# Patient Record
Sex: Male | Born: 1985 | Race: White | Hispanic: No | Marital: Single | Smoking: Former smoker
Health system: Southern US, Community
[De-identification: ages and names within clinical notes are randomized; demographics above are authoritative.]

---

## 2015-03-30 ENCOUNTER — Ambulatory Visit: Payer: Worker's Compensation

## 2015-03-30 ENCOUNTER — Ambulatory Visit (INDEPENDENT_AMBULATORY_CARE_PROVIDER_SITE_OTHER): Payer: Worker's Compensation | Admitting: Family Medicine

## 2015-03-30 VITALS — BP 114/72 | HR 97 | Temp 99.1°F | Resp 18 | Ht 74.0 in | Wt 251.0 lb

## 2015-03-30 DIAGNOSIS — M79675 Pain in left toe(s): Secondary | ICD-10-CM

## 2015-03-30 DIAGNOSIS — Y990 Civilian activity done for income or pay: Secondary | ICD-10-CM

## 2015-03-30 DIAGNOSIS — S90122A Contusion of left lesser toe(s) without damage to nail, initial encounter: Secondary | ICD-10-CM

## 2015-03-30 NOTE — Patient Instructions (Addendum)
Take ibuprofen 800 mg (4200 mg) maximum of 3 times daily for pain  Ice several times daily for 2 or 3 days  Wear postop shoe or a fairly firm sole shoe of his liking.  Return in one week for recheck

## 2015-03-30 NOTE — Progress Notes (Signed)
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 He dropped a 30 pound pull up on his foot this morning at work. The angle age of the polyp hit his left foot through the sneaker shoes that he had on. He has an abrasion and bruising of the DIP and distal aspect of the dorsum of the left large toe. It hurts.  Objective: Ecchymosis and abrasion from the DIP joint to the base of the nail is noted. There is no subungual hematoma noted. There is not much tenderness on the sole of the toe, but the area of bruising in the joint are quite tender. He says there is a numbness sensation around the end of the nail.  Assessment: Left large toe injury  Plan: X-ray large toe  [facility] reading (PRIMARY) by  Dr. Alwyn RenHopper No fracture noted  Contusion and pain left great toe  Plan: Will have him wear a postop shoe and get rechecked in 1 week, sooner if it is feeling very well and he wants to get out of shoe..Marland Kitchen

## 2016-11-08 IMAGING — CR DG TOE GREAT 2+V*L*
3 series · 3 of 3 positions shown · non-contrast
Comparison: None.

CLINICAL DATA: Dropped weight on foot

EXAM:
LEFT GREAT TOE

[AP]
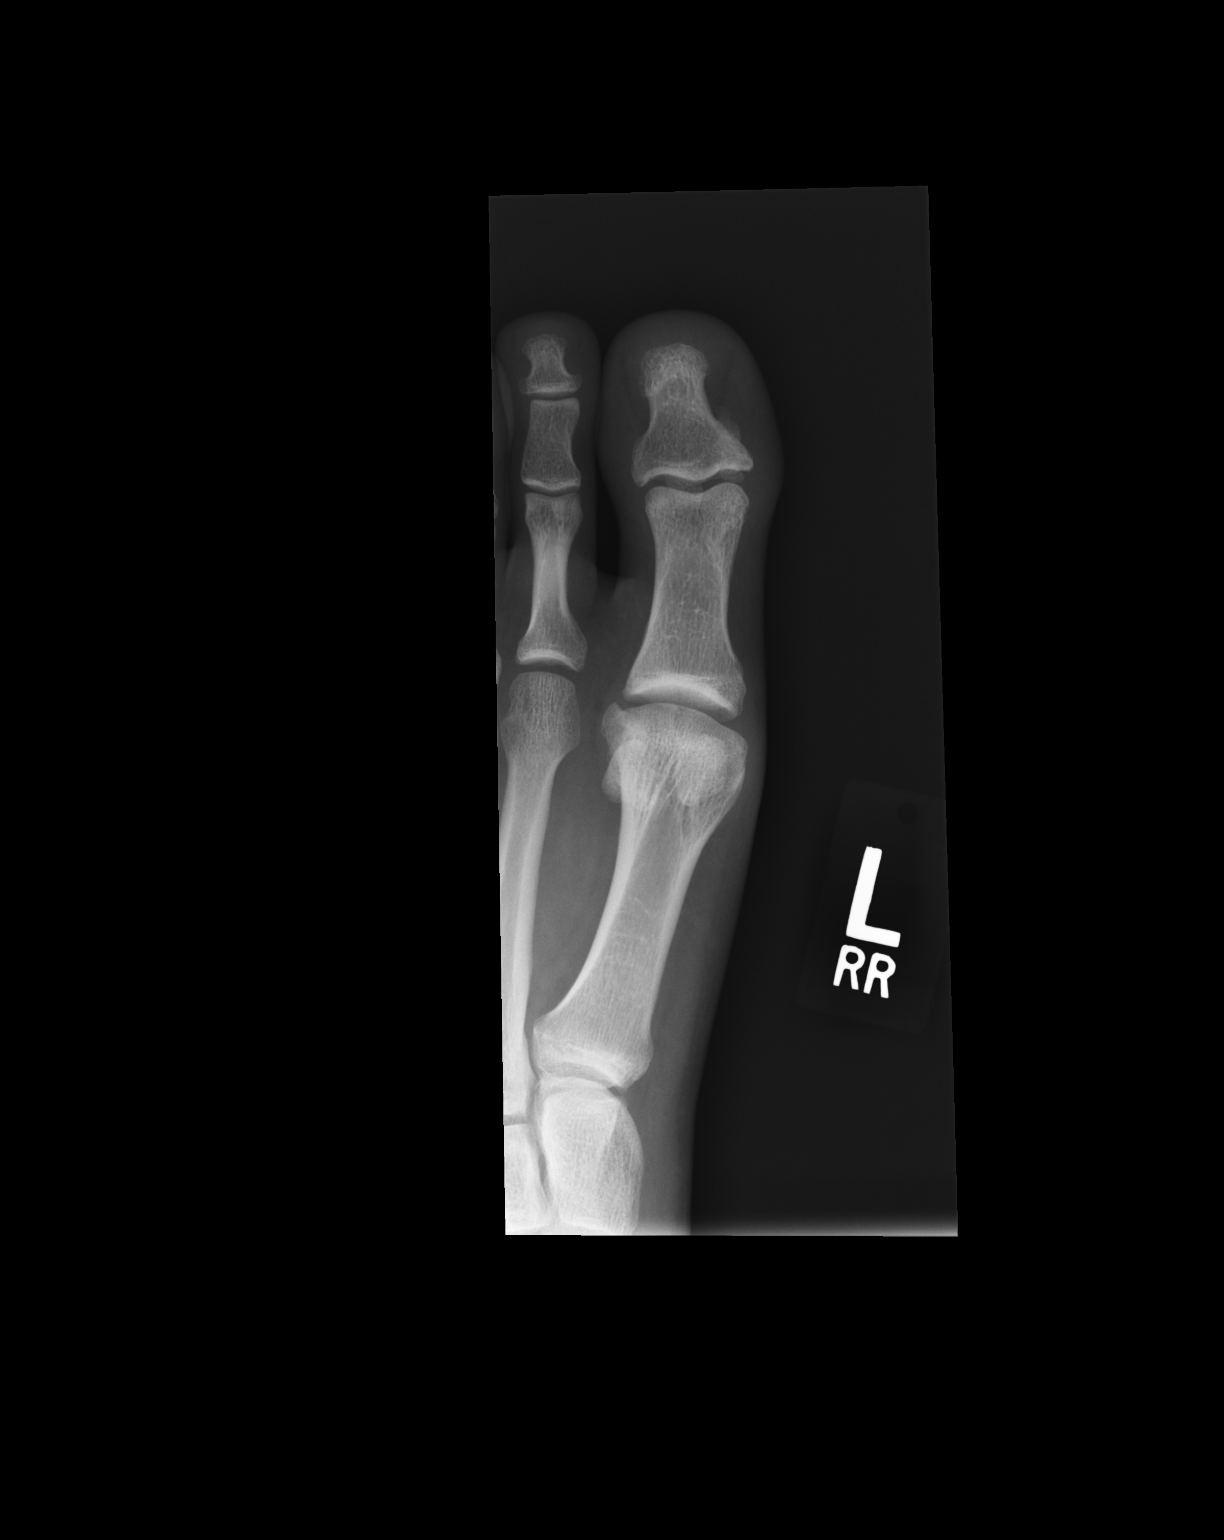

[oblique]
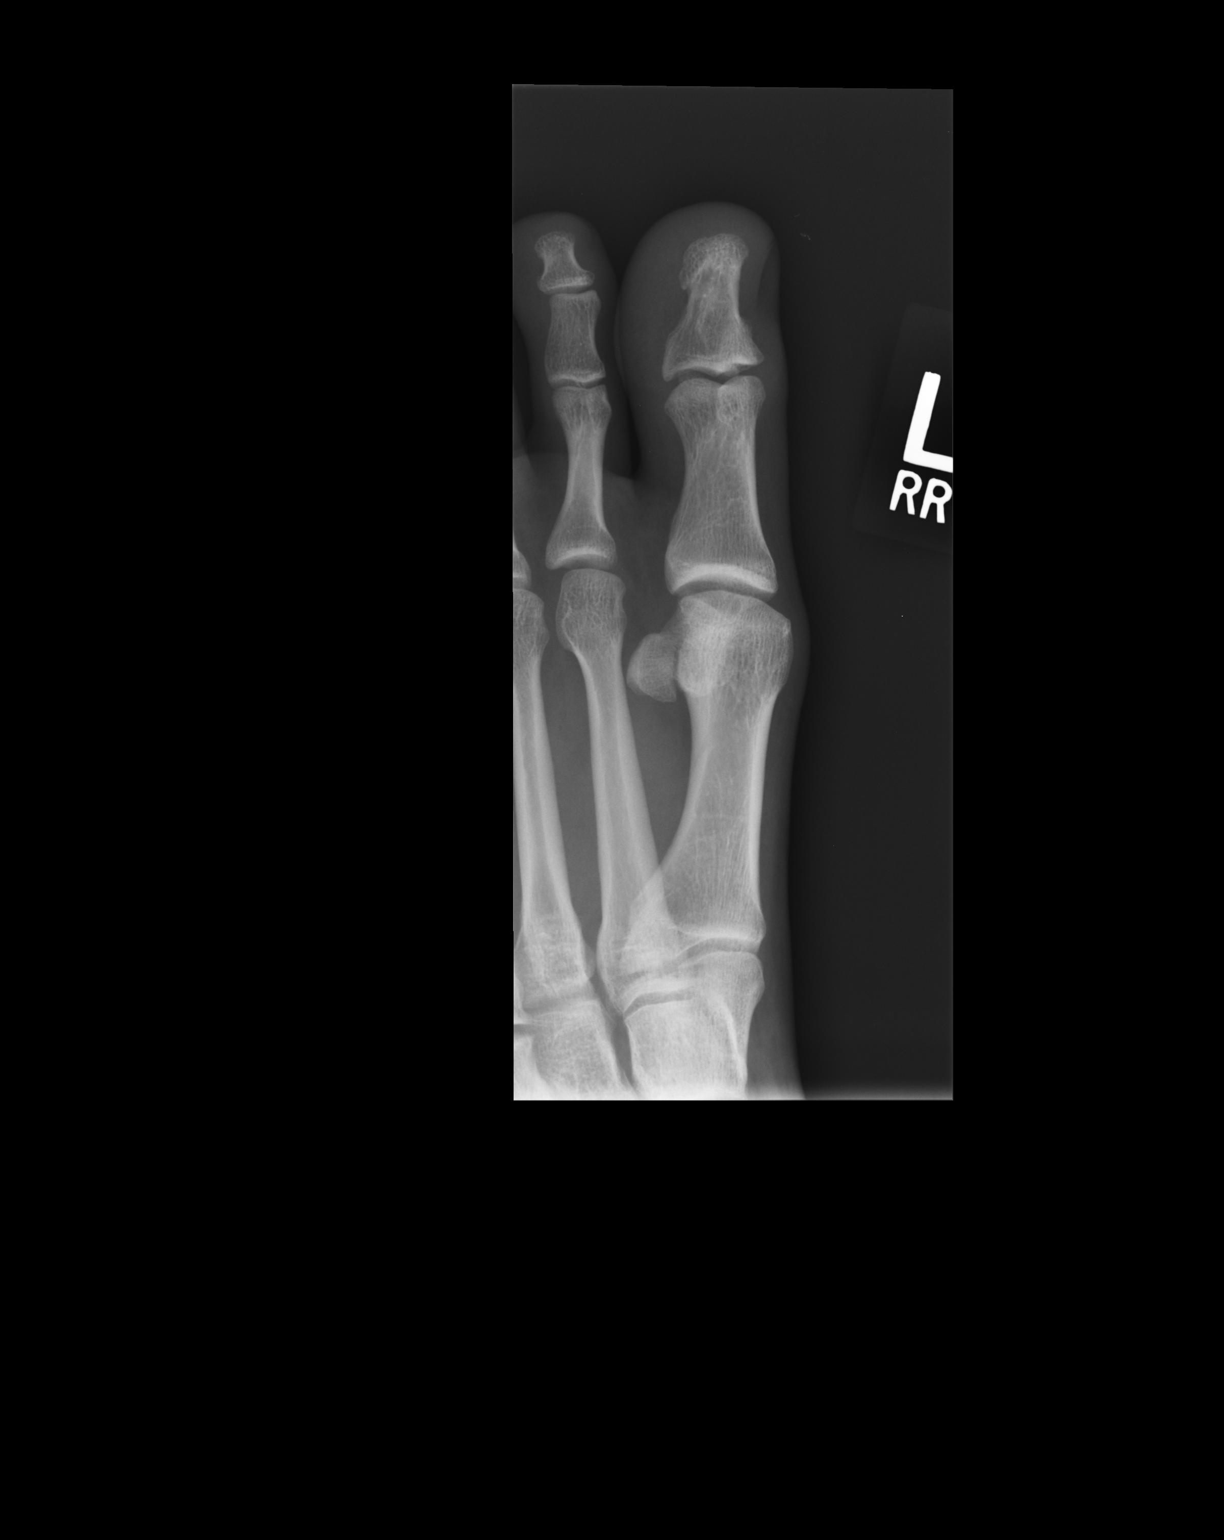

[lateral]
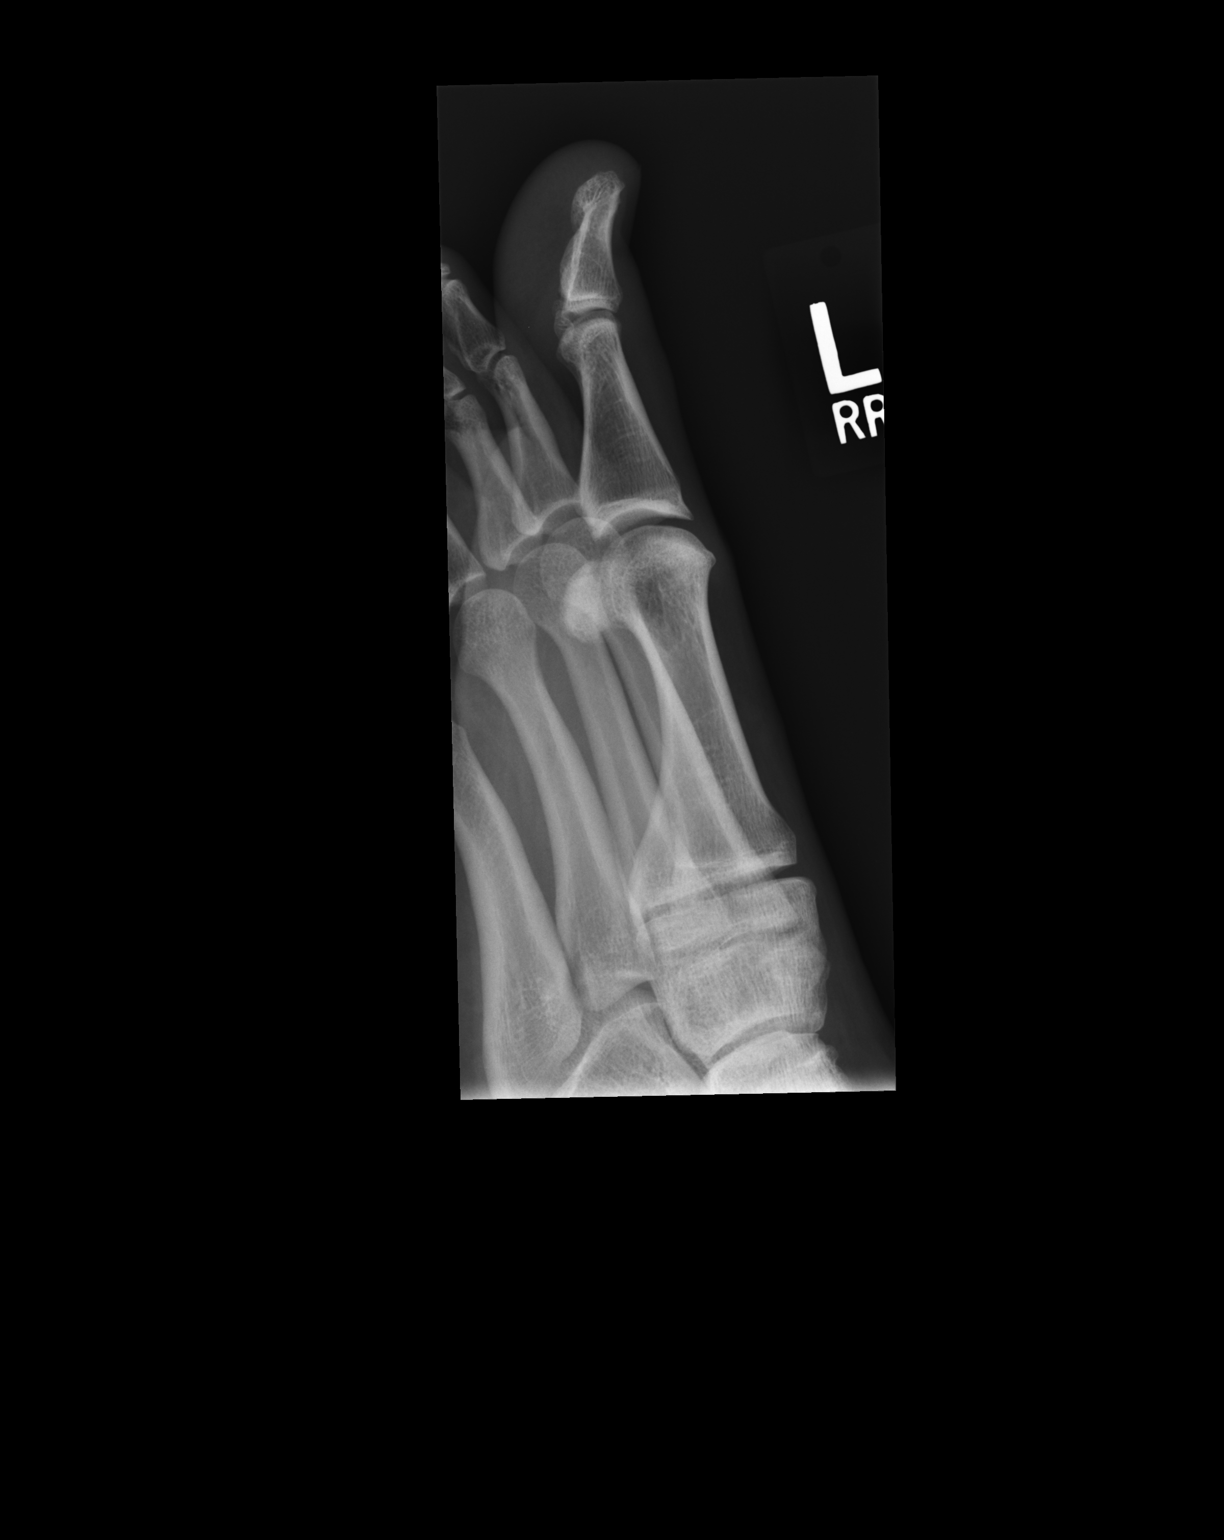

[3 of 3 positions shown; findings below may reference images not displayed]

FINDINGS: No fracture or dislocation is seen.

Mild degenerative changes of the 1st MTP joint.

Visualized soft tissues are within normal limits.
IMPRESSION: No fracture or dislocation is seen.
